# Patient Record
Sex: Female | Born: 1995 | Race: White | Hispanic: No | Marital: Single | Smoking: Never smoker
Health system: Southern US, Community
[De-identification: ages and names within clinical notes are randomized; demographics above are authoritative.]

## PROBLEM LIST (undated history)

## (undated) DIAGNOSIS — M799 Soft tissue disorder, unspecified: Secondary | ICD-10-CM

## (undated) DIAGNOSIS — D509 Iron deficiency anemia, unspecified: Secondary | ICD-10-CM

## (undated) DIAGNOSIS — Z8614 Personal history of Methicillin resistant Staphylococcus aureus infection: Secondary | ICD-10-CM

## (undated) HISTORY — PX: DENTAL SURGERY: SHX609

---
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
## 2016-02-05 DIAGNOSIS — D509 Iron deficiency anemia, unspecified: Secondary | ICD-10-CM | POA: Insufficient documentation

## 2016-12-06 DIAGNOSIS — N3000 Acute cystitis without hematuria: Secondary | ICD-10-CM | POA: Insufficient documentation

## 2017-09-15 ENCOUNTER — Other Ambulatory Visit: Payer: Self-pay | Admitting: Advanced Practice Midwife

## 2017-09-15 DIAGNOSIS — N6314 Unspecified lump in the right breast, lower inner quadrant: Secondary | ICD-10-CM

## 2017-09-23 ENCOUNTER — Other Ambulatory Visit: Payer: Self-pay | Admitting: Advanced Practice Midwife

## 2017-09-23 ENCOUNTER — Ambulatory Visit
Admission: RE | Admit: 2017-09-23 | Discharge: 2017-09-23 | Disposition: A | Discharge status: Home / Self Care | Payer: Medicaid Other | Source: Ambulatory Visit | Attending: Advanced Practice Midwife | Admitting: Advanced Practice Midwife

## 2017-09-23 DIAGNOSIS — N6310 Unspecified lump in the right breast, unspecified quadrant: Secondary | ICD-10-CM

## 2017-09-23 DIAGNOSIS — N6314 Unspecified lump in the right breast, lower inner quadrant: Secondary | ICD-10-CM

## 2018-03-24 ENCOUNTER — Inpatient Hospital Stay
Admission: RE | Admit: 2018-03-24 | Discharge: 2018-03-24 | Disposition: A | Discharge status: Home / Self Care | Payer: Medicaid Other | Source: Ambulatory Visit | Attending: Advanced Practice Midwife | Admitting: Advanced Practice Midwife

## 2018-04-01 ENCOUNTER — Other Ambulatory Visit: Payer: Self-pay

## 2018-04-01 ENCOUNTER — Emergency Department (HOSPITAL_COMMUNITY)
Admission: EM | Admit: 2018-04-01 | Discharge: 2018-04-01 | Disposition: A | Discharge status: Home / Self Care | Payer: Medicaid Other | Attending: Emergency Medicine | Admitting: Emergency Medicine

## 2018-04-01 ENCOUNTER — Emergency Department (HOSPITAL_COMMUNITY): Payer: Self-pay

## 2018-04-01 ENCOUNTER — Encounter (HOSPITAL_COMMUNITY): Payer: Self-pay | Admitting: Emergency Medicine

## 2018-04-01 DIAGNOSIS — S93422A Sprain of deltoid ligament of left ankle, initial encounter: Secondary | ICD-10-CM | POA: Insufficient documentation

## 2018-04-01 DIAGNOSIS — Y999 Unspecified external cause status: Secondary | ICD-10-CM | POA: Insufficient documentation

## 2018-04-01 DIAGNOSIS — Y9289 Other specified places as the place of occurrence of the external cause: Secondary | ICD-10-CM | POA: Insufficient documentation

## 2018-04-01 DIAGNOSIS — S80812A Abrasion, left lower leg, initial encounter: Secondary | ICD-10-CM | POA: Insufficient documentation

## 2018-04-01 DIAGNOSIS — Y9389 Activity, other specified: Secondary | ICD-10-CM | POA: Insufficient documentation

## 2018-04-01 DIAGNOSIS — Z79899 Other long term (current) drug therapy: Secondary | ICD-10-CM | POA: Insufficient documentation

## 2018-04-01 LAB — I-STAT BETA HCG BLOOD, ED (MC, WL, AP ONLY): I-stat hCG, quantitative: <5 m[IU]/mL (ref <5)

## 2018-04-01 NOTE — Discharge Instructions (Addendum)
Keep the abrasions clean and dry. Do use antibiotic ointment on the abrasions. You can take ibuprofen 600 mg + acetaminophen 650 mg 4 times a day for pain. Wear the ankle support. You should have your ankle rechecked if still painful in a week. You can call Dr Brooke Bonito office in [place], Dr Randel Pigg office in [place] or if you have an orthopedist, see that one. Use the crutches until you are able to walk on that foot.
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
## 2018-04-03 ENCOUNTER — Inpatient Hospital Stay: Admission: RE | Admit: 2018-04-03 | Payer: Medicaid Other | Source: Ambulatory Visit

## 2018-04-20 ENCOUNTER — Other Ambulatory Visit: Payer: Medicaid Other

## 2018-05-28 ENCOUNTER — Other Ambulatory Visit: Payer: Medicaid Other

## 2018-06-08 ENCOUNTER — Other Ambulatory Visit: Payer: Self-pay | Admitting: Advanced Practice Midwife

## 2018-06-08 ENCOUNTER — Ambulatory Visit
Admission: RE | Admit: 2018-06-08 | Discharge: 2018-06-08 | Disposition: A | Discharge status: Home / Self Care | Payer: BLUE CROSS/BLUE SHIELD | Source: Ambulatory Visit | Attending: Advanced Practice Midwife | Admitting: Advanced Practice Midwife

## 2018-06-08 DIAGNOSIS — N6310 Unspecified lump in the right breast, unspecified quadrant: Secondary | ICD-10-CM

## 2018-06-15 ENCOUNTER — Ambulatory Visit
Admission: RE | Admit: 2018-06-15 | Discharge: 2018-06-15 | Disposition: A | Discharge status: Home / Self Care | Payer: BLUE CROSS/BLUE SHIELD | Source: Ambulatory Visit | Attending: Advanced Practice Midwife | Admitting: Advanced Practice Midwife

## 2018-06-15 DIAGNOSIS — N6310 Unspecified lump in the right breast, unspecified quadrant: Secondary | ICD-10-CM

## 2018-11-23 ENCOUNTER — Ambulatory Visit: Payer: Self-pay | Admitting: *Deleted

## 2018-11-23 NOTE — Telephone Encounter (Signed)
Patient spent time with a friend last week whose father has been hospitalized with pneumonia. Her friend reports that the hospital is treating him as if he has the virus by isolating him. No corona test has been performed that has been told to the patient. Patient denies fever-does not have a thermometer but is trying to purchase at this time. Denies difficulty breathing. Denies travel to hot zones. Suggested patient quarantine self  and monitor her temperature with a thermometer. Reviewed symptoms to watch for and call back. Reviewed safety hygiene precautions. Stated she understood and knew when to call back.  Reason for Disposition . [1] COVID-19 EXPOSURE (Close Contact) within last 14 days AND [2] NO cough, fever, or breathing difficulty  Answer Assessment - Initial Assessment Questions 1. CONFIRMED CASE: "Who is the person with the confirmed COVID-19 infection that you were exposed to?"    Has not been confirmed 2. PLACE of CONTACT: "Where were you when you were exposed to COVID-19  (coronavirus disease 2019)?" (e.g., city, state, country)     3. TYPE of CONTACT: "How much contact was there?" (e.g., live in same house, work in same office, same school)    Occasionally over last week  4. DATE of CONTACT: "When did you have contact with a coronavirus patient?" (e.g., days)    Last week 5. DURATION of CONTACT: "How long were you in contact with the COVID-19 (coronavirus disease) patient?" (e.g., a few seconds, passed by person, a few minutes, live with the patient)    No direct contact 6. SYMPTOMS: "Do you have any symptoms?" (e.g., fever, cough, breathing difficulty)    No fever no difficulty breathing. 7. PREGNANCY OR POSTPARTUM: "Is there any chance you are pregnant?" "When was your last menstrual period?" "Did you deliver in the last 2 weeks?"     Did not ask 8. HIGH RISK: "Do you have any heart or lung problems? Do you have a weakened immune system?" (e.g., CHF, COPD, asthma, HIV positive,  chemotherapy, renal failure, diabetes mellitus, sickle cell anemia)    none  Protocols used: CORONAVIRUS (COVID-19) EXPOSURE-A-AH

## 2019-08-03 IMAGING — US ULTRASOUND RIGHT BREAST LIMITED
2 series · 5 of 5 positions shown · non-contrast
Comparison: Previous exam(s).

CLINICAL DATA: 22-year-old female presenting for first six-month
follow-up of a probably benign palpable right breast lump. Patient
states she subjectively feels this area may have increased in size
the interim.

EXAM:
ULTRASOUND OF THE RIGHT BREAST

[Series 1: ultrasound right breast limited · 0.06mm/px · 4 of 4 slices shown (1 of 2)]
[im 1/4]
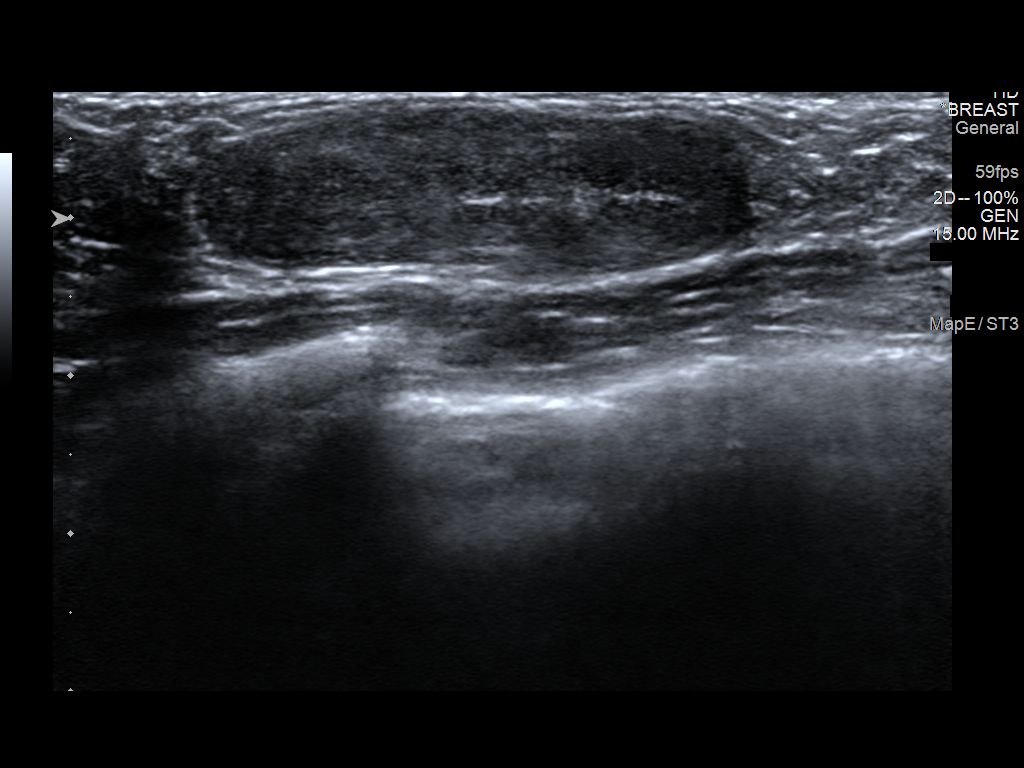
[im 2/4]
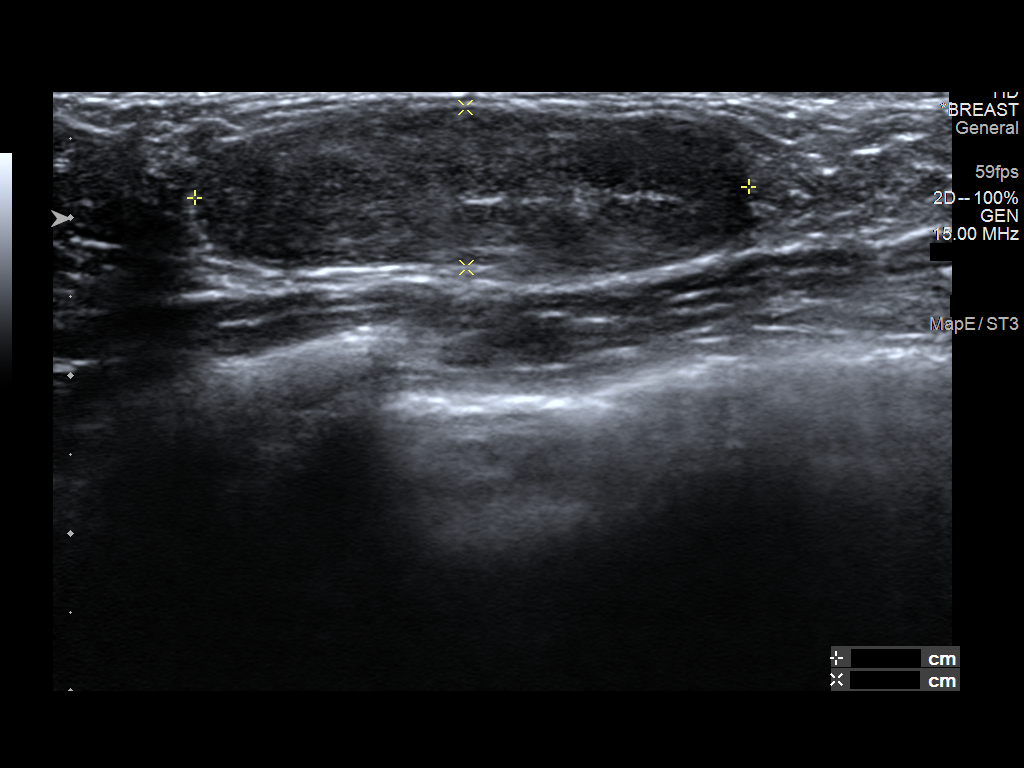
[im 3/4]
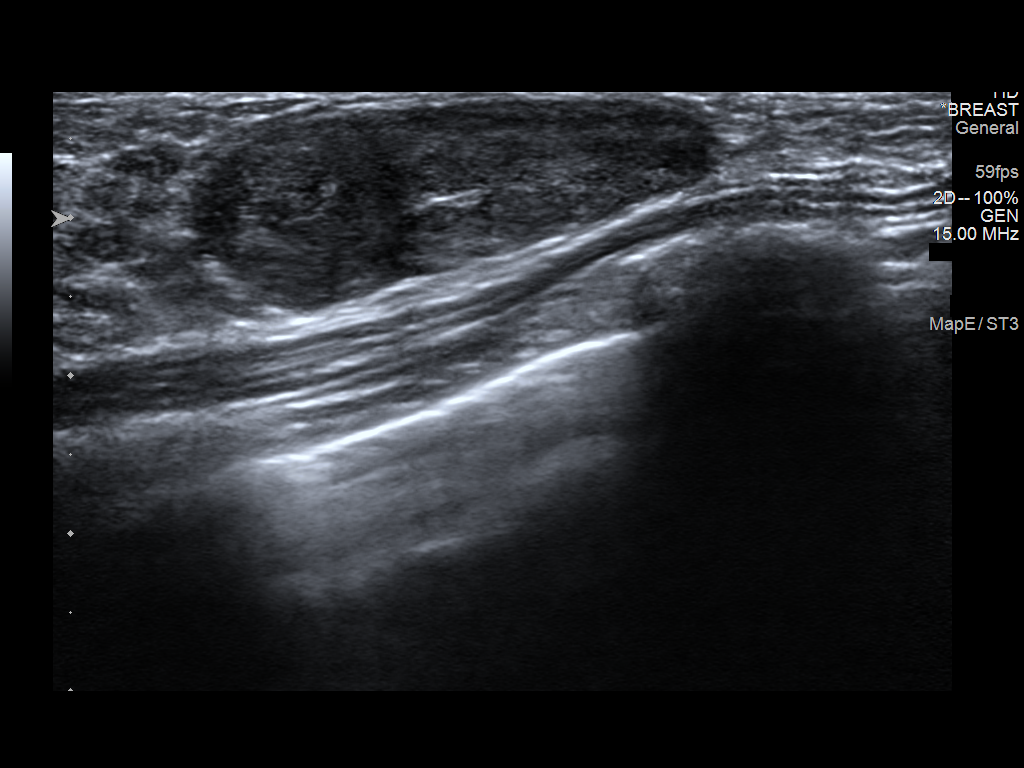
[im 4/4]
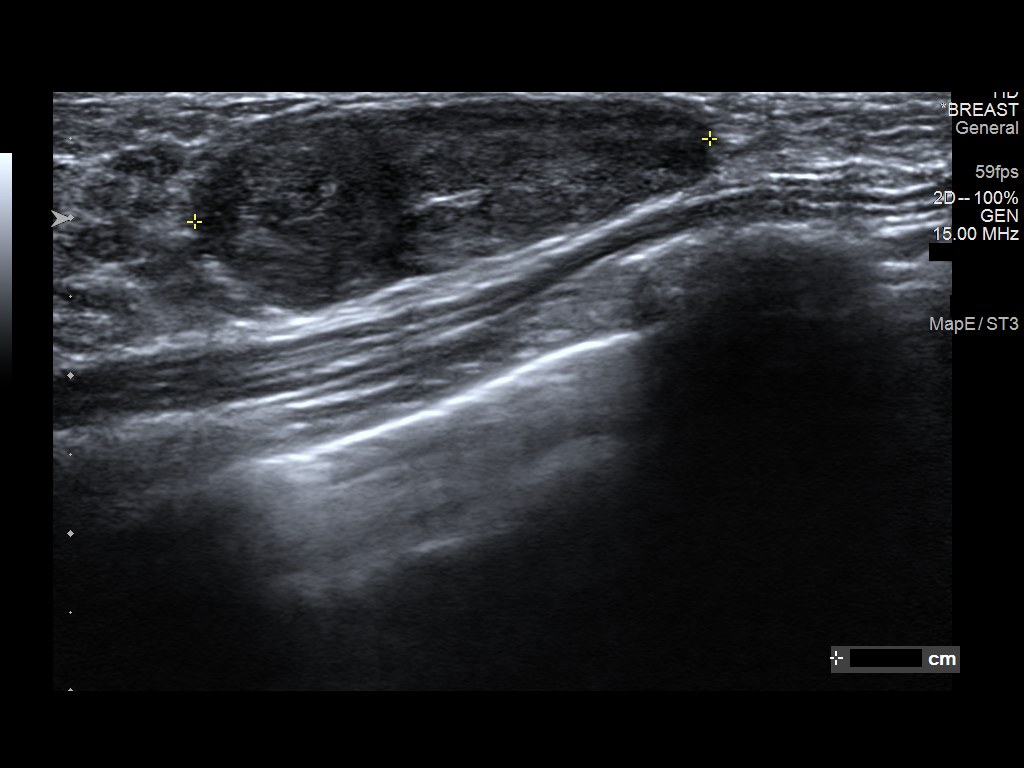

[Series 2: ultrasound right breast limited · 0.06mm/px · 1 of 1 slices shown (2 of 2)]
[im 1/1]
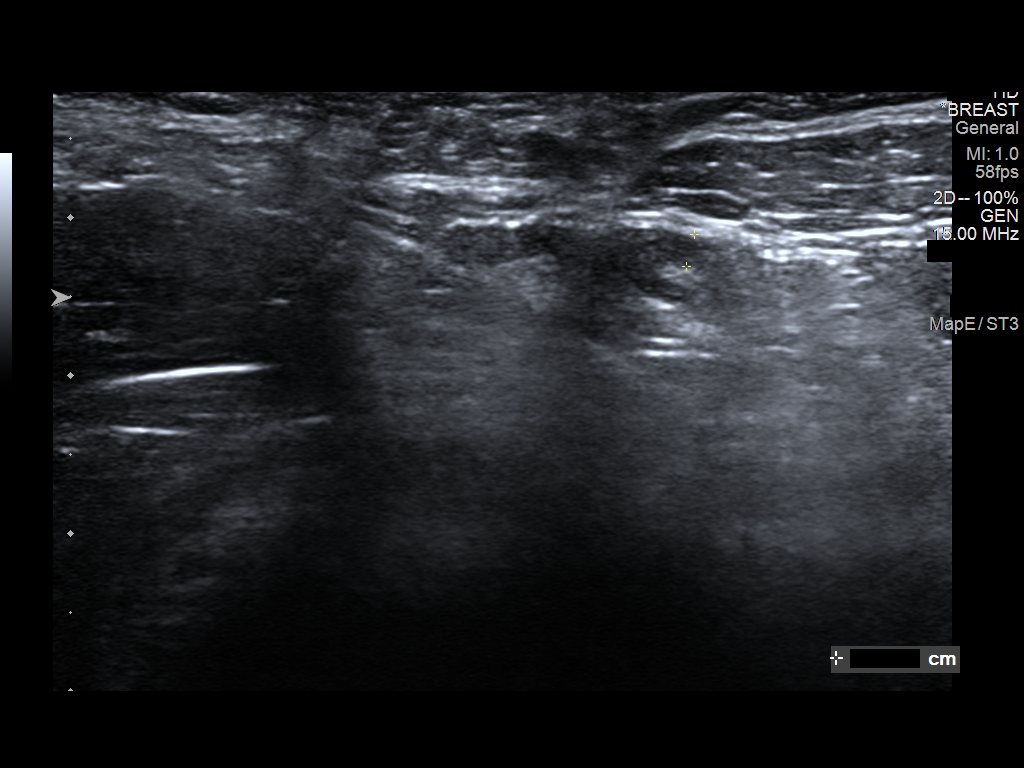

[5 of 5 positions shown; findings below may reference images not displayed]

FINDINGS: Targeted ultrasound is performed, showing similar appearance of an
oval, circumscribed hypoechoic mass at the 5 o'clock position 4 cm
from the nipple. Current measurements are 3.5 x 3.3 x 1 cm
(previously 3.0 x 2.8 x 1 cm). This represents a greater than 20%
increase in size.

Evaluation of the right axilla demonstrates no suspicious
lymphadenopathy.
IMPRESSION: 1. Indeterminate right breast mass. Recommendation is for
ultrasound-guided biopsy. Number
2. No suspicious right axillary lymphadenopathy.

RECOMMENDATION:
Ultrasound-guided biopsy for the patient's palpable right breast
mass.

I have discussed the findings and recommendations with the patient.
Results were also provided in writing at the conclusion of the
visit. If applicable, a reminder letter will be sent to the patient
regarding the next appointment.

BI-RADS CATEGORY  4: Suspicious.

## 2019-08-10 IMAGING — US US BREAST BX W LOC DEV 1ST LESION IMG BX SPEC US GUIDE*R*
1 series · 10 of 10 positions shown · non-contrast
Comparison: Previous exam(s).

ADDENDUM:
Pathology revealed FIBROADENOMA of the Right breast, 5 o'clock to 6
o'clock. This was found to be concordant by Dr. Mnsr Regrag.

Pathology results were discussed with the patient by telephone. The
patient reported doing well after the biopsy with tenderness at the
site. Post biopsy instructions and care were reviewed and questions
were answered. The patient was encouraged to call The [REDACTED]
The patient was instructed to continue with monthly self breast
examinations, clinical follow-up as needed, and to return for annual
mammography at 41. The patient was informed a reminder notice would
be sent regarding this appointment.
Pathology results reported by Don Lolito Onacram, RN on 06/16/2018.
CLINICAL DATA: Ultrasound-guided core needle biopsy was suggested
of a palpable right breast mass at 5 o'clock position 4 cm from the
nipple. The patient is 22 years old.
EXAM:
ULTRASOUND GUIDED RIGHT BREAST CORE NEEDLE BIOPSY

[Series 1: us breast bx w loc dev 1st lesion img bx spec us g · 0.07mm/px · 10 of 10 slices shown]
[im 1/10]
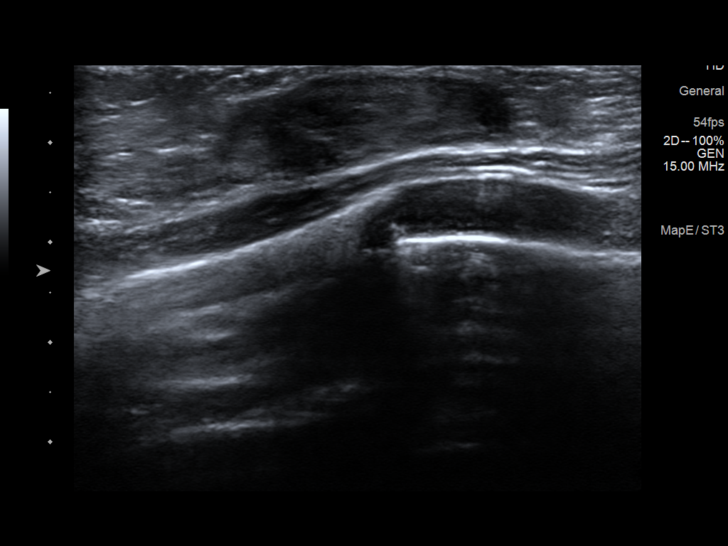
[im 2/10]
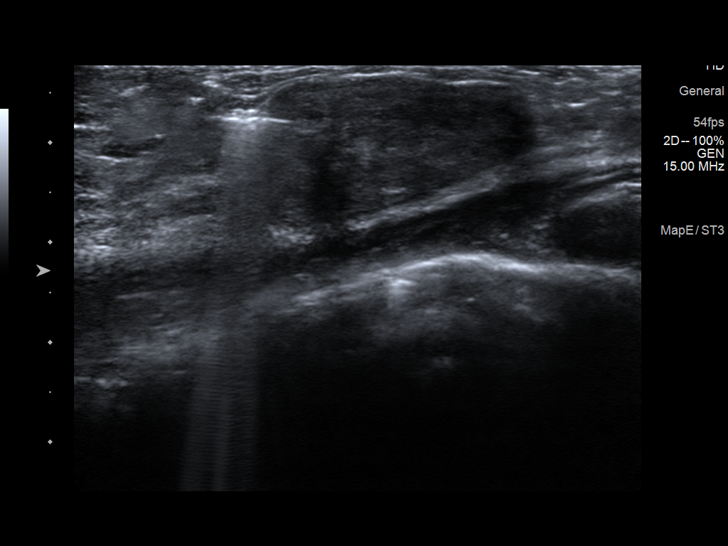
[im 3/10]
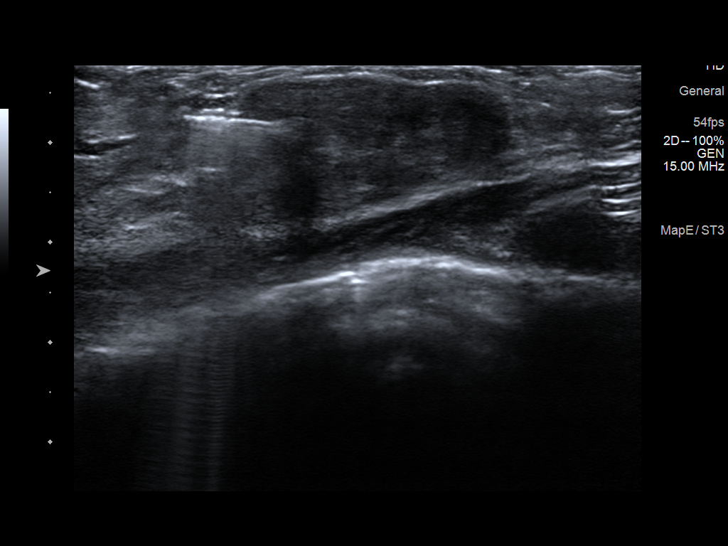
[im 4/10]
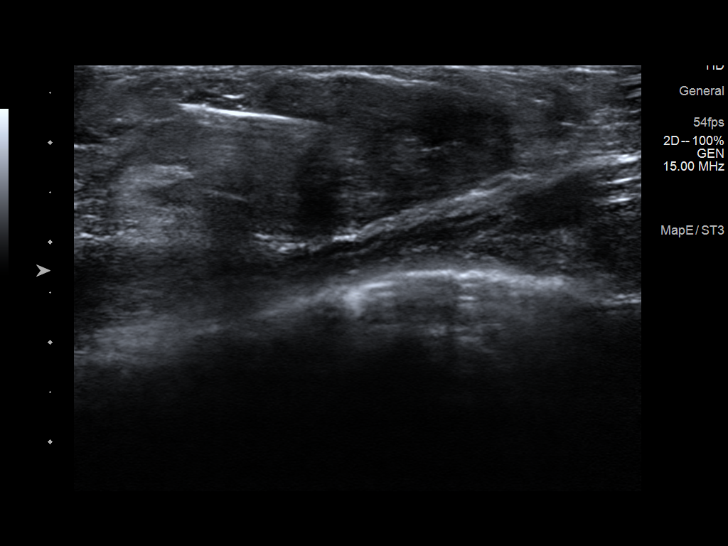
[im 5/10]
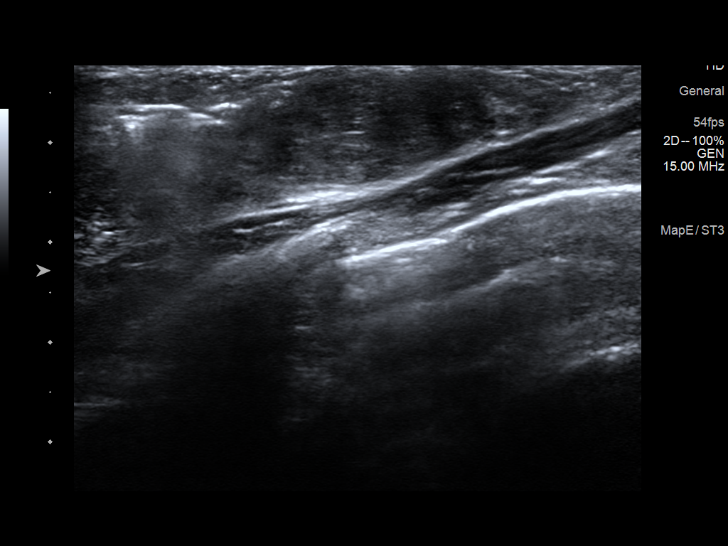
[im 6/10]
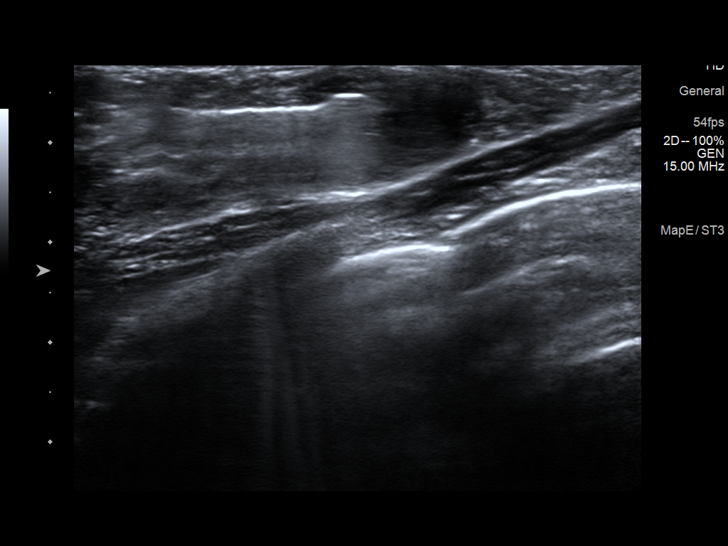
[im 7/10]
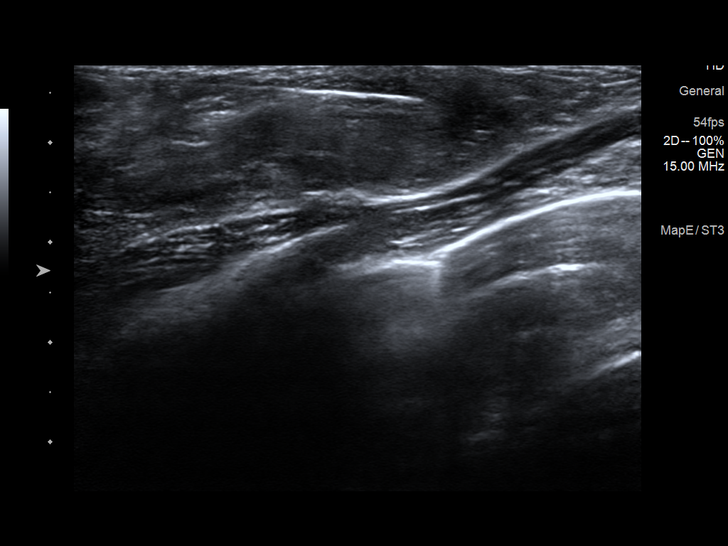
[im 8/10]
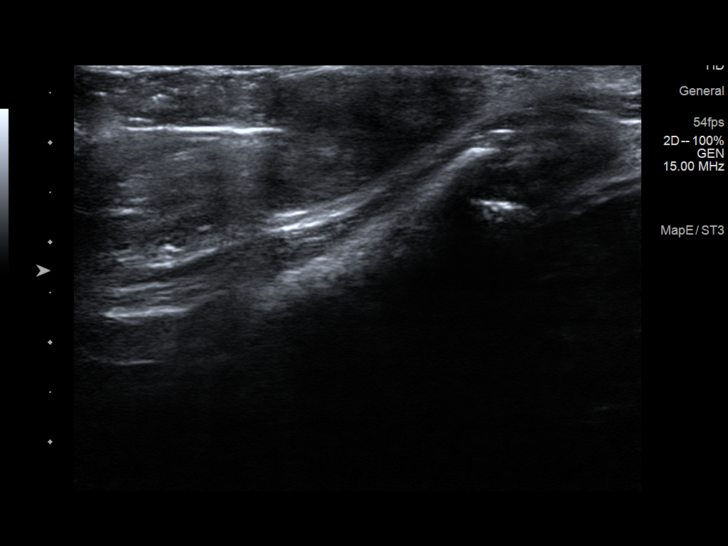
[im 9/10]
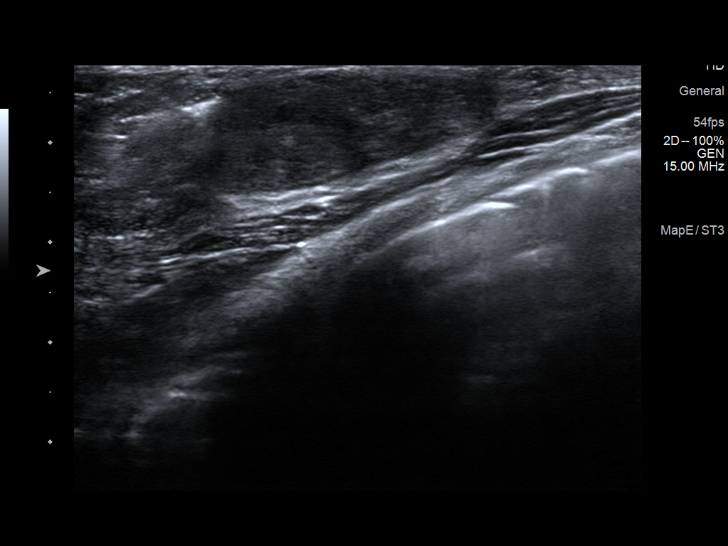
[im 10/10]
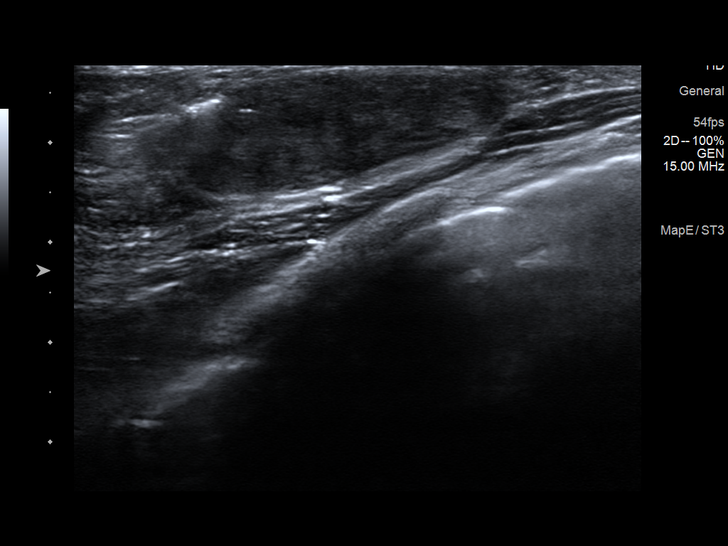

[10 of 10 positions shown; findings below may reference images not displayed]



Lesion quadrant: Lower inner quadrant

Using sterile technique and 1% Lidocaine as local anesthetic, under
direct ultrasound visualization, a 14 gauge Buitrago device was
used to perform biopsy of a hypoechoic circumscribed parallel mass
at 5 o'clock position using a lateral approach. At the conclusion of
the procedure a HydroMARK tissue marker clip was deployed into the
biopsy cavity along the lateral edge of the mass.
IMPRESSION: Ultrasound guided biopsy of the right breast. No apparent
complications.

## 2023-04-20 ENCOUNTER — Emergency Department (HOSPITAL_BASED_OUTPATIENT_CLINIC_OR_DEPARTMENT_OTHER): Payer: Medicaid Other | Admitting: Radiology

## 2023-04-20 ENCOUNTER — Emergency Department (HOSPITAL_BASED_OUTPATIENT_CLINIC_OR_DEPARTMENT_OTHER): Payer: Medicaid Other

## 2023-04-20 ENCOUNTER — Other Ambulatory Visit: Payer: Self-pay

## 2023-04-20 ENCOUNTER — Encounter (HOSPITAL_BASED_OUTPATIENT_CLINIC_OR_DEPARTMENT_OTHER): Payer: Self-pay | Admitting: Emergency Medicine

## 2023-04-20 ENCOUNTER — Emergency Department (HOSPITAL_BASED_OUTPATIENT_CLINIC_OR_DEPARTMENT_OTHER)
Admission: EM | Admit: 2023-04-20 | Discharge: 2023-04-20 | Disposition: A | Discharge status: Home / Self Care | Payer: Medicaid Other | Attending: Emergency Medicine | Admitting: Emergency Medicine

## 2023-04-20 DIAGNOSIS — R0789 Other chest pain: Secondary | ICD-10-CM | POA: Insufficient documentation

## 2023-04-20 DIAGNOSIS — R1084 Generalized abdominal pain: Secondary | ICD-10-CM | POA: Diagnosis present

## 2023-04-20 DIAGNOSIS — K5900 Constipation, unspecified: Secondary | ICD-10-CM | POA: Diagnosis not present

## 2023-04-20 DIAGNOSIS — R791 Abnormal coagulation profile: Secondary | ICD-10-CM | POA: Insufficient documentation

## 2023-04-20 DIAGNOSIS — R079 Chest pain, unspecified: Secondary | ICD-10-CM

## 2023-04-20 LAB — COMPREHENSIVE METABOLIC PANEL
ALT: 26 U/L (ref 0–44)
AST: 22 U/L (ref 15–41)
Albumin: 4.7 g/dL (ref 3.5–5.0)
Alkaline Phosphatase: 48 U/L (ref 38–126)
Anion gap: 8 (ref 5–15)
BUN: 8 mg/dL (ref 6–20)
CO2: 26 mmol/L (ref 22–32)
Calcium: 9.3 mg/dL (ref 8.9–10.3)
Chloride: 102 mmol/L (ref 98–111)
Creatinine, Ser: 0.76 mg/dL (ref 0.44–1.00)
GFR, Estimated: >60 mL/min (ref >60)
Glucose, Bld: 82 mg/dL (ref 70–99)
Potassium: 3.8 mmol/L (ref 3.5–5.1)
Sodium: 136 mmol/L (ref 135–145)
Total Bilirubin: 1 mg/dL (ref 0.3–1.2)
Total Protein: 7.8 g/dL (ref 6.5–8.1)

## 2023-04-20 LAB — URINALYSIS, ROUTINE W REFLEX MICROSCOPIC
Bilirubin Urine: NEGATIVE
Glucose, UA: NEGATIVE mg/dL
Hgb urine dipstick: NEGATIVE
Ketones, ur: NEGATIVE mg/dL
Leukocytes,Ua: NEGATIVE
Nitrite: NEGATIVE
Protein, ur: NEGATIVE mg/dL
Specific Gravity, Urine: 1.013 (ref 1.005–1.030)
pH: 5.5 (ref 5.0–8.0)

## 2023-04-20 LAB — TROPONIN I (HIGH SENSITIVITY)
Troponin I (High Sensitivity): <2 ng/L (ref <18)
Troponin I (High Sensitivity): <2 ng/L (ref <18)

## 2023-04-20 LAB — CBC
HCT: 37.4 % (ref 36.0–46.0)
Hemoglobin: 12.7 g/dL (ref 12.0–15.0)
MCH: 30.1 pg (ref 26.0–34.0)
MCHC: 34 g/dL (ref 30.0–36.0)
MCV: 88.6 fL (ref 80.0–100.0)
Platelets: 228 10*3/uL (ref 150–400)
RBC: 4.22 MIL/uL (ref 3.87–5.11)
RDW: 12.6 % (ref 11.5–15.5)
WBC: 6.8 10*3/uL (ref 4.0–10.5)
nRBC: 0 % (ref 0.0–0.2)

## 2023-04-20 LAB — D-DIMER, QUANTITATIVE: D-Dimer, Quant: 0.56 ug{FEU}/mL — ABNORMAL HIGH (ref 0.00–0.50)

## 2023-04-20 LAB — PREGNANCY, URINE: Preg Test, Ur: NEGATIVE

## 2023-04-20 LAB — LIPASE, BLOOD: Lipase: 24 U/L (ref 11–51)

## 2023-04-20 MED ORDER — IOHEXOL 350 MG/ML SOLN
100.0000 mL | Freq: Once | INTRAVENOUS | Status: AC | PRN
  Administered 2023-04-20: 75 mL via INTRAVENOUS

## 2023-04-20 MED ORDER — SIMETHICONE 40 MG/0.6ML PO SUSP (UNIT DOSE)
40.0000 mg | Freq: Once | ORAL | Status: AC
  Administered 2023-04-20: 40 mg via ORAL
  Filled 2023-04-20: qty 0.6

## 2023-04-20 NOTE — ED Triage Notes (Signed)
Last week ,Tuesday had severe pain in lower abd and around to her left side of back, was constipated, eventually had bm that was hard and felt better. States her lower abd stayed tender, yesterday pain started in lower abd and up her right side to her chest area. This pain feels sharp. Lbm yesterday.

## 2023-04-20 NOTE — ED Triage Notes (Signed)
Pt feels like whens he tries to yawn or take deep breath her right side feels like she can't get a deep breath, feels "blocked". Pt had been onvacation, did drive 5.5 hrs.

## 2023-04-20 NOTE — ED Notes (Signed)
Patient resting quietly in stretcher, respirations even, unlabored, no acute distress noted. Denies needs at this time.  

## 2023-04-20 NOTE — ED Notes (Signed)
Reviewed AVS with patient, patient expressed understanding of directions, denies further questions at this time. 

## 2023-04-20 NOTE — Discharge Instructions (Addendum)
You were seen in the ER today for evaluation of your belly pain and nonspecific chest pain.  Your CT imaging showed that you have constipated.  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  Your lab work is unremarkable.  Please make sure you are staying well-hydrated drinking plenty of fluids, mainly water.  I recommend trying MiraLAX to help soften your stool to allow you to poop.  You try 1 scoop in the morning and mix it with Gatorade, coffee, or water.  If you does not produce a bowel movement you can try another scoop at night.  If you still have not produced a bowel movement, you can try 2 scoops in the morning.  Please make sure you follow with a primary care doctor for reevaluation.  If you have any concerns of any worsening symptoms, please return to the nearest emergency department for evaluation.  Contact a health care provider if: You have pain that gets worse. You have a fever. You do not have a bowel movement after 4 days. You vomit. You are not hungry or you lose weight. You are bleeding from the opening between the buttocks (anus). You have thin, pencil-like stools. Get help right away if: You have a fever and your symptoms suddenly get worse. You leak stool or have blood in your stool. Your abdomen is bloated. You have severe pain in your abdomen. You feel dizzy or you faint.

## 2023-04-20 NOTE — ED Provider Notes (Signed)
[place] EMERGENCY DEPARTMENT AT [facility] Provider Note   CSN: [id number] Arrival date & time: 04/20/23  1511     History {Add pertinent medical, surgical, social history, OB history to HPI:1} No chief complaint on file.   Chelsea Contreras is a 27 y.o. female.  HPI     Home Medications Prior to Admission medications   Medication Sig Start Date End Date Taking? Authorizing Provider  ferrous fumarate (HEMOCYTE - 106 MG FE) 325 (106 FE) MG TABS tablet Take 1 tablet by mouth.    [provider]  norethindrone-ethinyl estradiol (OVCON-50) 1-50 MG-MCG tablet Take 1 tablet by mouth daily.    [provider]      Allergies    Patient has no known allergies.    Review of Systems   Review of Systems  Physical Exam Updated Vital Signs BP 110/67   Pulse 67   Temp 98.1 F (36.7 C)   Resp 18   SpO2 97%  Physical Exam  ED Results / Procedures / Treatments   Labs (all labs ordered are listed, but only abnormal results are displayed) Labs Reviewed  D-DIMER, QUANTITATIVE - Abnormal; Notable for the following components:      Result Value   D-Dimer, Quant 0.56 (*)    All other components within normal limits  LIPASE, BLOOD  COMPREHENSIVE METABOLIC PANEL  CBC  URINALYSIS, ROUTINE W REFLEX MICROSCOPIC  PREGNANCY, URINE  TROPONIN I (HIGH SENSITIVITY)  TROPONIN I (HIGH SENSITIVITY)    EKG None  Radiology CT Angio Chest PE W and/or Wo Contrast  Result Date: 04/20/2023 CLINICAL DATA:  Pulmonary embolism (PE) suspected, low to intermediate prob, positive D-dimer; Abdominal pain, acute, nonlocalized. Right-sided chest pain. EXAM: CT ANGIOGRAPHY CHEST CT ABDOMEN AND PELVIS WITH CONTRAST TECHNIQUE: Multidetector CT imaging of the chest was performed using the standard protocol during bolus administration of intravenous contrast. Multiplanar CT image reconstructions and MIPs were obtained to evaluate the vascular anatomy. Multidetector CT imaging of  the abdomen and pelvis was performed using the standard protocol during bolus administration of intravenous contrast. RADIATION DOSE REDUCTION: This exam was performed according to the departmental dose-optimization program which includes automated exposure control, adjustment of the mA and/or kV according to patient size and/or use of iterative reconstruction technique. CONTRAST:  75mL OMNIPAQUE IOHEXOL 350 MG/ML SOLN COMPARISON:  Breast ultrasound 06/15/2018. FINDINGS: CTA CHEST FINDINGS Cardiovascular: Satisfactory opacification of the pulmonary arteries to the segmental level. No evidence of pulmonary embolism. Normal heart size. No pericardial effusion. Mediastinum/Nodes: No enlarged mediastinal, hilar, or axillary lymph nodes. Thyroid gland, trachea, and esophagus demonstrate no significant findings. Lungs/Pleura: Lungs are clear. No pleural effusion or pneumothorax. 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 No acute bony abnormality. Review of the MIP images confirms the above findings. CT ABDOMEN and PELVIS FINDINGS Hepatobiliary: No focal liver abnormality is seen. No gallstones, gallbladder wall thickening, or biliary dilatation. Pancreas: Unremarkable. No pancreatic ductal dilatation or surrounding inflammatory changes. Spleen: Normal in size without focal abnormality. Adrenals/Urinary Tract: Adrenal glands are unremarkable. Kidneys are normal, without renal calculi, focal lesion, or hydronephrosis. Bladder is unremarkable. Stomach/Bowel: Normal stomach and duodenum. No dilated loops of small bowel. Normal appendix is visualized on coronal image 49 series 5. Colon is unremarkable. No bowel wall thickening or surrounding inflammation. Vascular/Lymphatic: No significant vascular findings are present. No enlarged abdominal or pelvic lymph nodes. Reproductive: 3.9 cm peripherally enhancing cyst in the right ovary, likely follicular cyst (no  follow-up imaging is recommended). The uterus and left adnexa are unremarkable. Other: Small volume free fluid in the pelvis, likely physiologic. Musculoskeletal: No acute or significant osseous findings. Review of the MIP images confirms the above findings. IMPRESSION: 1. No evidence of pulmonary embolism or other acute intrathoracic process. 2. No acute findings in the abdomen or pelvis. Electronically Signed   By: Orvan Falconer M.D.   On: 04/20/2023 18:12   CT ABDOMEN PELVIS W CONTRAST  Result Date: 04/20/2023 CLINICAL DATA:  Pulmonary embolism (PE) suspected, low to intermediate prob, positive D-dimer; Abdominal pain, acute, nonlocalized. Right-sided chest pain. EXAM: CT ANGIOGRAPHY CHEST CT ABDOMEN AND PELVIS WITH CONTRAST TECHNIQUE: Multidetector CT imaging of the chest was performed using the standard protocol during bolus administration of intravenous contrast. Multiplanar CT image reconstructions and MIPs were obtained to evaluate the vascular anatomy. Multidetector CT imaging of the abdomen and pelvis was performed using the standard protocol during bolus administration of intravenous contrast. RADIATION DOSE REDUCTION: This exam was performed according to the departmental dose-optimization program which includes automated exposure control, adjustment of the mA and/or kV according to patient size and/or use of iterative reconstruction technique. CONTRAST:  75mL OMNIPAQUE IOHEXOL 350 MG/ML SOLN COMPARISON:  Breast ultrasound 06/15/2018. FINDINGS: CTA CHEST FINDINGS Cardiovascular: Satisfactory opacification of the pulmonary arteries to the segmental level. No evidence of pulmonary embolism. Normal heart size. No pericardial effusion. Mediastinum/Nodes: No enlarged mediastinal, hilar, or axillary lymph nodes. Thyroid gland, trachea, and esophagus demonstrate no significant findings. Lungs/Pleura: Lungs are clear. No pleural effusion or pneumothorax. Musculoskeletal: Ovoid mass in the lower right  breast, consistent with previously biopsy proven fibroadenoma. No acute bony abnormality. Review of the MIP images confirms the above findings. CT ABDOMEN and PELVIS FINDINGS Hepatobiliary: No focal liver abnormality is seen. No gallstones, gallbladder wall thickening, or biliary dilatation. Pancreas: Unremarkable. No pancreatic ductal dilatation or surrounding inflammatory changes. Spleen: Normal in size without focal abnormality. Adrenals/Urinary Tract: Adrenal glands are unremarkable. Kidneys are normal, without renal calculi, focal lesion, or hydronephrosis. Bladder is unremarkable. Stomach/Bowel: Normal stomach and duodenum. No dilated loops of small bowel. Normal appendix is visualized on coronal image 49 series 5. Colon is unremarkable. No bowel wall thickening or surrounding inflammation. Vascular/Lymphatic: No significant vascular findings are present. No enlarged abdominal or pelvic lymph nodes. Reproductive: 3.9 cm peripherally enhancing cyst in the right ovary, likely follicular cyst (no follow-up imaging is recommended). The uterus and left adnexa are unremarkable. Other: Small volume free fluid in the pelvis, likely physiologic. Musculoskeletal: No acute or significant osseous findings. Review of the MIP images confirms the above findings. IMPRESSION: 1. No evidence of pulmonary embolism or other acute intrathoracic process. 2. No acute findings in the abdomen or pelvis. Electronically Signed   By: Orvan Falconer M.D.   On: 04/20/2023 18:12   DG Chest 2 View  Result Date: 04/20/2023 CLINICAL DATA:  Difficulty breathing EXAM: CHEST - 2 VIEW COMPARISON:  None Available. FINDINGS: The heart size and mediastinal contours are within normal limits. Both lungs are clear. The visualized skeletal structures are unremarkable. IMPRESSION: No active cardiopulmonary disease. Electronically Signed   By: Darliss Cheney M.D.   On: 04/20/2023 17:49    Procedures Procedures  {Document cardiac monitor, telemetry  assessment procedure when appropriate:1}  Medications Ordered in ED Medications  simethicone (MYLICON) 40 mg/0.48ml suspension 40 mg (has no administration in time range)  iohexol (OMNIPAQUE) 350 MG/ML injection 100 mL (75 mLs Intravenous Contrast Given 04/20/23 1754)    ED Course/ Medical  Decision Making/ A&P   {   Click here for ABCD2, HEART and other calculatorsREFRESH Note before signing :1}                              Medical Decision Making Amount and/or Complexity of Data Reviewed Labs: ordered. Radiology: ordered.  Risk Prescription drug management.   ***  {Document critical care time when appropriate:1} {Document review of labs and clinical decision tools ie heart score, Chads2Vasc2 etc:1}  {Document your independent review of radiology images, and any outside records:1} {Document your discussion with family members, caretakers, and with consultants:1} {Document social determinants of health affecting pt's care:1} {Document your decision making why or why not admission, treatments were needed:1} Final Clinical Impression(s) / ED Diagnoses Final diagnoses:  None    Rx / DC Orders ED Discharge Orders     None

## 2023-07-12 ENCOUNTER — Emergency Department (HOSPITAL_BASED_OUTPATIENT_CLINIC_OR_DEPARTMENT_OTHER): Payer: Medicaid Other

## 2023-07-12 ENCOUNTER — Emergency Department (HOSPITAL_BASED_OUTPATIENT_CLINIC_OR_DEPARTMENT_OTHER)
Admission: EM | Admit: 2023-07-12 | Discharge: 2023-07-12 | Disposition: A | Discharge status: Home / Self Care | Payer: Medicaid Other | Attending: Emergency Medicine | Admitting: Emergency Medicine

## 2023-07-12 ENCOUNTER — Other Ambulatory Visit: Payer: Self-pay

## 2023-07-12 DIAGNOSIS — D72829 Elevated white blood cell count, unspecified: Secondary | ICD-10-CM | POA: Diagnosis not present

## 2023-07-12 DIAGNOSIS — E871 Hypo-osmolality and hyponatremia: Secondary | ICD-10-CM | POA: Insufficient documentation

## 2023-07-12 DIAGNOSIS — R109 Unspecified abdominal pain: Secondary | ICD-10-CM | POA: Diagnosis present

## 2023-07-12 DIAGNOSIS — K649 Unspecified hemorrhoids: Secondary | ICD-10-CM | POA: Insufficient documentation

## 2023-07-12 DIAGNOSIS — R195 Other fecal abnormalities: Secondary | ICD-10-CM | POA: Insufficient documentation

## 2023-07-12 DIAGNOSIS — R1084 Generalized abdominal pain: Secondary | ICD-10-CM

## 2023-07-12 LAB — COMPREHENSIVE METABOLIC PANEL
ALT: 12 U/L (ref 0–44)
AST: 15 U/L (ref 15–41)
Albumin: 4.8 g/dL (ref 3.5–5.0)
Alkaline Phosphatase: 54 U/L (ref 38–126)
Anion gap: 6 (ref 5–15)
BUN: 7 mg/dL (ref 6–20)
CO2: 27 mmol/L (ref 22–32)
Calcium: 9.9 mg/dL (ref 8.9–10.3)
Chloride: 101 mmol/L (ref 98–111)
Creatinine, Ser: 0.67 mg/dL (ref 0.44–1.00)
GFR, Estimated: >60 mL/min (ref >60)
Glucose, Bld: 111 mg/dL — ABNORMAL HIGH (ref 70–99)
Potassium: 4.1 mmol/L (ref 3.5–5.1)
Sodium: 134 mmol/L — ABNORMAL LOW (ref 135–145)
Total Bilirubin: 0.8 mg/dL (ref <1.2)
Total Protein: 7.9 g/dL (ref 6.5–8.1)

## 2023-07-12 LAB — CBC
HCT: 40.3 % (ref 36.0–46.0)
Hemoglobin: 13.8 g/dL (ref 12.0–15.0)
MCH: 30 pg (ref 26.0–34.0)
MCHC: 34.2 g/dL (ref 30.0–36.0)
MCV: 87.6 fL (ref 80.0–100.0)
Platelets: 222 10*3/uL (ref 150–400)
RBC: 4.6 MIL/uL (ref 3.87–5.11)
RDW: 12.9 % (ref 11.5–15.5)
WBC: 10.7 10*3/uL — ABNORMAL HIGH (ref 4.0–10.5)
nRBC: 0 % (ref 0.0–0.2)

## 2023-07-12 LAB — URINALYSIS, ROUTINE W REFLEX MICROSCOPIC
Bilirubin Urine: NEGATIVE
Glucose, UA: NEGATIVE mg/dL
Hgb urine dipstick: NEGATIVE
Ketones, ur: NEGATIVE mg/dL
Leukocytes,Ua: NEGATIVE
Nitrite: NEGATIVE
Protein, ur: NEGATIVE mg/dL
Specific Gravity, Urine: 1.019 (ref 1.005–1.030)
pH: 7.5 (ref 5.0–8.0)

## 2023-07-12 LAB — PREGNANCY, URINE: Preg Test, Ur: NEGATIVE

## 2023-07-12 LAB — LIPASE, BLOOD: Lipase: 14 U/L (ref 11–51)

## 2023-07-12 LAB — OCCULT BLOOD X 1 CARD TO LAB, STOOL: Fecal Occult Bld: POSITIVE — AB

## 2023-07-12 MED ORDER — IOHEXOL 300 MG/ML  SOLN
100.0000 mL | Freq: Once | INTRAMUSCULAR | Status: AC | PRN
  Administered 2023-07-12: 100 mL via INTRAVENOUS

## 2023-07-12 MED ORDER — DICYCLOMINE HCL 20 MG PO TABS: 20.0000 mg | ORAL_TABLET | Freq: Two times a day (BID) | ORAL | 0 refills | Status: AC | PRN

## 2023-07-12 MED ORDER — KETOROLAC TROMETHAMINE 15 MG/ML IJ SOLN
15.0000 mg | Freq: Once | INTRAMUSCULAR | Status: AC
  Administered 2023-07-12: 15 mg via INTRAVENOUS
  Filled 2023-07-12: qty 1

## 2023-07-12 MED ORDER — DICYCLOMINE HCL 10 MG PO CAPS
10.0000 mg | ORAL_CAPSULE | Freq: Once | ORAL | Status: AC
  Administered 2023-07-12: 10 mg via ORAL
  Filled 2023-07-12: qty 1

## 2023-07-12 MED ORDER — ONDANSETRON 4 MG PO TBDP
4.0000 mg | ORAL_TABLET | Freq: Once | ORAL | Status: AC | PRN
  Administered 2023-07-12: 4 mg via ORAL
  Filled 2023-07-12: qty 1

## 2023-07-12 NOTE — ED Provider Notes (Signed)
[place] EMERGENCY DEPARTMENT AT [facility] Provider Note   CSN: [id number] Arrival date & time: 07/12/23  1451     History  Chief Complaint  Patient presents with   Abdominal Pain    Chelsea Contreras is a 27 y.o. female.   Abdominal Pain   27 year old female presents to the emergency department with complaints of abdominal pain.  Patient states that abdominal pain began this morning after awakening.  States that she had the urge to have a bowel movement and reportedly had loose bowel movement with evidence of streaky blood in stool.  States that she also had period of feeling nauseated with 1 episode of emesis today that was nonbloody or coffee-ground in appearance.  States she has been dealing with intermittent abdominal pain ever since August when she was initially seen in the emergency department.  Had negative CT scan at that time.  Has been following with GI in the outpatient setting has had 1 appointment.  Denies any fever, chills, urinary symptoms, vaginal symptoms.  Past medical history significant for anemia  Home Medications Prior to Admission medications   Medication Sig Start Date End Date Taking? Authorizing Provider  dicyclomine (BENTYL) 20 MG tablet Take 1 tablet (20 mg total) by mouth 2 (two) times daily as needed for spasms. 07/12/23  Yes Sherian Maroon A, PA  ferrous fumarate (HEMOCYTE - 106 MG FE) 325 (106 FE) MG TABS tablet Take 1 tablet by mouth.    [provider]  norethindrone-ethinyl estradiol (OVCON-50) 1-50 MG-MCG tablet Take 1 tablet by mouth daily.    [provider]      Allergies    Patient has no known allergies.    Review of Systems   Review of Systems  Gastrointestinal:  Positive for abdominal pain.  All other systems reviewed and are negative.   Physical Exam Updated Vital Signs BP 111/67   Pulse 62   Temp 98.5 F (36.9 C)   Resp 18   Ht 5\' 2"  (1.575 m)   Wt 52.6 kg   LMP 06/23/2023 (Exact Date)   SpO2  98%   BMI 21.22 kg/m  Physical Exam Vitals and nursing note reviewed. Exam conducted with a chaperone present.  Constitutional:      General: She is not in acute distress.    Appearance: She is well-developed.  HENT:     Head: Normocephalic and atraumatic.  Eyes:     Conjunctiva/sclera: Conjunctivae normal.  Cardiovascular:     Rate and Rhythm: Normal rate and regular rhythm.     Heart sounds: No murmur heard. Pulmonary:     Effort: Pulmonary effort is normal. No respiratory distress.     Breath sounds: Normal breath sounds.  Abdominal:     Palpations: Abdomen is soft.     Tenderness: There is abdominal tenderness in the right lower quadrant, epigastric area and left upper quadrant. There is no right CVA tenderness, left CVA tenderness or guarding.  Genitourinary:    Comments: Rectal exam performed with female nurse staff at bedside.  External exam did show evidence of external hemorrhoid at 4 o'clock position.  Hemorrhoid nonthrombosed with no appreciable active bleeding.  No internal palpable mass/fissure.  Small amount of clay colored stool obtained and sent via Hemoccult which was positive. Musculoskeletal:        General: No swelling.     Cervical back: Neck supple.  Skin:    General: Skin is warm and dry.     Capillary Refill: Capillary  refill takes less than 2 seconds.  Neurological:     Mental Status: She is alert.  Psychiatric:        Mood and Affect: Mood normal.     ED Results / Procedures / Treatments   Labs (all labs ordered are listed, but only abnormal results are displayed) Labs Reviewed  COMPREHENSIVE METABOLIC PANEL - Abnormal; Notable for the following components:      Result Value   Sodium 134 (*)    Glucose, Bld 111 (*)    All other components within normal limits  CBC - Abnormal; Notable for the following components:   WBC 10.7 (*)    All other components within normal limits  OCCULT BLOOD X 1 CARD TO LAB, STOOL - Abnormal; Notable for the  following components:   Fecal Occult Bld POSITIVE (*)    All other components within normal limits  LIPASE, BLOOD  URINALYSIS, ROUTINE W REFLEX MICROSCOPIC  PREGNANCY, URINE    EKG None  Radiology CT ABDOMEN PELVIS W CONTRAST  Result Date: 07/12/2023 CLINICAL DATA:  Right lower quadrant pain EXAM: CT ABDOMEN AND PELVIS WITH CONTRAST TECHNIQUE: Multidetector CT imaging of the abdomen and pelvis was performed using the standard protocol following bolus administration of intravenous contrast. RADIATION DOSE REDUCTION: This exam was performed according to the departmental dose-optimization program which includes automated exposure control, adjustment of the mA and/or kV according to patient size and/or use of iterative reconstruction technique. CONTRAST:  OMNIPAQUE IOHEXOL 300 MG/ML  SOLN COMPARISON:  CT abdomen and pelvis 04/20/2023 FINDINGS: Lower chest: No acute abnormality. Hepatobiliary: There is a subcentimeter hypodensity in the right lobe of the liver image 2/21 which is unchanged from prior and too small to characterize. The liver is otherwise within normal limits. Gallbladder and bile ducts are within normal limits. Pancreas: Unremarkable. No pancreatic ductal dilatation or surrounding inflammatory changes. Spleen: Normal in size without focal abnormality. Adrenals/Urinary Tract: Adrenal glands are unremarkable. Kidneys are normal, without renal calculi, focal lesion, or hydronephrosis. Bladder is unremarkable. Stomach/Bowel: The colon is completely decompressed from the level of the transverse colon to the level the rectum. There is no surrounding inflammation, but mild diffuse wall thickening is not excluded. The appendix, small bowel and stomach are within normal limits. Vascular/Lymphatic: No significant vascular findings are present. No enlarged abdominal or pelvic lymph nodes. Reproductive: There is a collapsing cyst or follicle in the right ovary measuring 2 cm which has decreased in  size. No follow-up imaging necessary. The left ovary and uterus are within normal limits. Other: There is trace free fluid in the pelvis. There is no focal abdominal wall hernia. Musculoskeletal: No acute or significant osseous findings. IMPRESSION: 1. The colon is completely decompressed from the level of the transverse colon to the level the rectum and mild wall thickening is not excluded. Correlate clinically for colitis. 2. Trace free fluid in the pelvis. 3. Collapsing cyst or follicle in the right ovary measuring 2 cm. No follow-up imaging necessary. 4. Normal appendix. Electronically Signed   By: Darliss Cheney M.D.   On: 07/12/2023 17:38    Procedures Procedures    Medications Ordered in ED Medications  ondansetron (ZOFRAN-ODT) disintegrating tablet 4 mg (4 mg Oral Given 07/12/23 1703)  dicyclomine (BENTYL) capsule 10 mg (10 mg Oral Given 07/12/23 1702)  ketorolac (TORADOL) 15 MG/ML injection 15 mg (15 mg Intravenous Given 07/12/23 1703)  iohexol (OMNIPAQUE) 300 MG/ML solution 100 mL (100 mLs Intravenous Contrast Given 07/12/23 1632)    ED  Course/ Medical Decision Making/ A&P                                 Medical Decision Making Amount and/or Complexity of Data Reviewed Labs: ordered. Radiology: ordered.  Risk Prescription drug management.   This patient presents to the ED for concern of abdominal pain, this involves an extensive number of treatment options, and is a complaint that carries with it a high risk of complications and morbidity.  The differential diagnosis includes appendicitis, ectopic pregnancy, ovarian torsion, diverticulitis, colitis, pyelonephritis, nephrolithiasis, cystitis, IBD, IBS, gastritis, PUD, CBD pathology, cholecystitis, other   Co morbidities that complicate the patient evaluation  See HPI   Additional history obtained:  Additional history obtained from EMR External records from outside source obtained and reviewed including hospital  records   Lab Tests:  I Ordered, and personally interpreted labs.  The pertinent results include: Slight leukocytosis of 10.7.  No evidence of anemia.  Platelets within normal range.  Mild hyponatremia 134 but otherwise electrolytes within normal limits.  No transaminitis.  No renal dysfunction.  UA without abnormality.  Urine pregnancy negative.  Occult positive   Imaging Studies ordered:  I ordered imaging studies including ct abdomen pelvis  I independently visualized and interpreted imaging which showed decompressed colon.  Trace of fluid in pelvis.  Collapsing cyst of right ovary.  Normal appendix. I agree with the radiologist interpretation  Cardiac Monitoring: / EKG:  The patient was maintained on a cardiac monitor.  I personally viewed and interpreted the cardiac monitored which showed an underlying rhythm of: sinus rhythm   Consultations Obtained:  N/a   Problem List / ED Course / Critical interventions / Medication management  Abdominal pain I ordered medication including Bentyl, Toradol, Zofran   Reevaluation of the patient after these medicines showed that the patient improved I have reviewed the patients home medicines and have made adjustments as needed   Social Determinants of Health:  Denies tobacco, licit drug use   Test / Admission - Considered:  Abdominal pain Vitals signs within normal range and stable throughout visit. Laboratory/imaging studies significant for: See above 27 year old female presents emergency department with complaints of abdominal pain and 1 episode of loose bowel movement with streaky blood as well as 1 episode of emesis.  Patient with history of intermittent abdominal pain with diarrhea/constipation alternating followed by GI in the outpatient setting.  Was seen in the ED in August for similar symptoms.  On exam, patient with tenderness right lower quadrant as well as epigastric and left upper quadrant region.  Exam did show evidence  of external hemorrhoid.  Nonthrombosed without active bleeding.  Small amount of liquid stool was obtained and sent which was Hemoccult positive labs reassuring.  CT with evidence of underdistended colon.  Patient did note resolution of abdominal pain with administration of Toradol as well as Bentyl.  Subsequently passage of p.o. trial.  Unsure of exact etiology of patient's symptoms but could be secondary to potentially underlying IBD, celiac, IBS.  Patient with [place] score of 7-8.  Patient already has establish care in the outpatient setting with GI.  Recommend use of Bentyl as needed, bland diet and follow-up with GI in the outpatient setting.  Treatment plan discussed at length with patient and she acknowledged understanding was agreeable to said plan.  Patient overall well-appearing, afebrile in no acute distress. Worrisome signs and symptoms were discussed with the patient, and  the patient acknowledged understanding to return to the ED if noticed. Patient was stable upon discharge.          Final Clinical Impression(s) / ED Diagnoses Final diagnoses:  Generalized abdominal pain  Fecal occult blood test positive    Rx / DC Orders ED Discharge Orders          Ordered    dicyclomine (BENTYL) 20 MG tablet  2 times daily PRN        07/12/23 1811              Peter Garter, [place] 07/12/23 1841    Terald Sleeper, MD 07/12/23 [id number]

## 2023-07-12 NOTE — ED Triage Notes (Signed)
Pt POV from home reporting sharp gen abd pain and bloody diarrhea since this morning + one episode of emesis. Multiple episodes of same past few weeks, seen by GI last week told workup was normal.

## 2023-07-12 NOTE — Discharge Instructions (Addendum)
As discussed, CT imaging was reassuring.  No obvious abnormality appreciated on CT imaging.  Your stool sample did come back positive for blood.  Will recommend bland diet as well as medication called Bentyl to take as needed for abdominal discomfort.  If blood becomes more pronounced filling up the toilet bowl, abdominal pain worsens or does not respond to medication, you develop intractable nausea/vomiting, you develop fever, please return to emergency department.  Otherwise, I recommend follow-up with GI in the outpatient setting as they will likely perform colonoscopy.

## 2023-07-12 NOTE — ED Notes (Signed)
Reviewed discharge instructions, medications, and home care with pt. Pt verbalized understanding and had no further questions. Pt exited ED without complications.

## 2024-03-15 ENCOUNTER — Ambulatory Visit: Admitting: Dermatology

## 2024-03-15 ENCOUNTER — Encounter: Payer: Self-pay | Admitting: Dermatology

## 2024-03-15 VITALS — BP 71/45

## 2024-03-15 DIAGNOSIS — L700 Acne vulgaris: Secondary | ICD-10-CM | POA: Diagnosis not present

## 2024-03-15 MED ORDER — DOXYCYCLINE HYCLATE 100 MG PO TABS: ORAL_TABLET | ORAL | 2 refills | Status: AC

## 2024-03-15 MED ORDER — TRETINOIN 0.025 % EX CREA: TOPICAL_CREAM | CUTANEOUS | 1 refills | Status: DC

## 2024-03-15 NOTE — Progress Notes (Signed)
 New Patient Visit   Subjective  Chelsea Contreras is a 28 y.o. female who presents for a NEW PATIENT appointment to be examined for the concerns as listed below.   Acne: Located at the jaw line that presented 2 years ago and would like to have it examined. The areas are painful if she has a cystic flare - rating it 5 out of 10. She has not previously been treated for these areas. She has tried OTC products but have not found anything that works for her. She is looking for product suggestions.   Patient reports Hx of Bx as a child - benign. Patient denied family Hx of skin cancer.   The following portions of the chart were reviewed this encounter and updated as appropriate: medications, allergies, medical history  Review of Systems:  No other skin or systemic complaints except as noted in HPI or Assessment and Plan.  Objective  Well appearing patient in no apparent distress; mood and affect are within normal limits.   A focused examination was performed of the following areas: jaw line   Relevant exam findings are noted in the Assessment and Plan.          Assessment & Plan   ACNE VULGARIS Exam: Open comedones and inflammatory papules  flared - Assessment: Young female patient with sensitive skin presenting with acne, including superficial lesions on the chin and occasional deep bumps. Acne appears hormone-related with cyclical flares. Evidence of post-inflammatory hyperpigmentation and mild scarring. Patient has been using over-the-counter products including Panoxyl, niacinamide, and various moisturizers.  - Plan:    Prescribe doxycycline  for acute flares:     - 1 tablet twice daily with food for up to 7 days     - Start at onset of flare     - Discontinue after 4 days if resolved or after 7 days regardless    Morning regimen:     - Continue Panoxyl face wash     - Apply prescription clindamycin  pledgets     - Apply moisturizer (Vanicream HC or Avene Tolerance)     -  Apply sunscreen (recommended Centella brand or continue current Cococon)    Evening regimen:     - Wash face with gentle cleanser (prescribed Avene gel cleanser)     - Apply thin layer of moisturizer (Vanicream facial lotion or Avene Tolerance)     - Apply pea-sized amount of tretinoin  0.025% every other night     - Apply Avene Cicalfate Balm as final step    Discontinue use of glycolic acid and other potentially irritating products    Patient education:     - Proper use of tretinoin , including potential side effects and sun sensitivity     - Adjust regimen in colder weather     - Restart routine at reduced frequency if irritation occurs     - Potential side effect of upset stomach with doxycycline  if not taken with substantial food  Topical retinoid medications like tretinoin /Retin-A , adapalene/Differin, tazarotene/Fabior, and Epiduo/Epiduo Forte can cause dryness and irritation when first started. Only apply a pea-sized amount to the entire affected area. Avoid applying it around the eyes, edges of mouth and creases at the nose. If you experience irritation, use a good moisturizer first and/or apply the medicine less often. If you are doing well with the medicine, you can increase how often you use it until you are applying every night. Be careful with sun protection while using this medication as it  can make you sensitive to the sun. This medicine should not be used by pregnant women.    Doxycycline  should be taken with food to prevent nausea. Do not lay down for 30 minutes after taking. Be cautious with sun exposure and use good sun protection while on this medication. Pregnant women should not take this medication.     ACNE VULGARIS    Return in about 4 months (around 07/16/2024) for acne.   Documentation: I have reviewed the above documentation for accuracy and completeness, and I agree with the above.  I, Shirron Maranda, CMA, am acting as scribe for [employer], DO.   Delon Lenis, DO

## 2024-03-15 NOTE — Patient Instructions (Addendum)
 Date: Mon Mar 15 2024  Hello [place],  Thank you for visiting today. Here is a summary of the key instructions:  Morning Routine: - Wash face with Panoxyl - Apply clindamycin  pledget (prescription swab) - Apply moisturizer - Apply sunscreen (Centella brand recommended, available at [email or web address])  Evening Routine: - Wash face with Avene gel cleanser - Apply thin layer of Avene Tolerance moisturizer or Vanicream facial lotion - Apply pea-sized amount of tretinoin  0.025% (prescription) every other night - Apply Avene Cicalfate Balm or Vanicream HC on top  Medications: - Doxycycline : Take 1 tablet twice a day with food for up to 7 days when you have a flare-up   - Stop after 4 days if flare-up resolves   - Always take with substantial food (e.g., apple, salad, and sandwich)  Skincare Products to Avoid: - Stop using glycolic acid - Do not use Paula's Choice exfoliation product  Follow-up: - Return for follow-up appointment in 4 months  Please reach out if you have any questions or concerns.  Warm regards,  Dr. Delon Lenis, Dermatology   Important Information  Due to recent changes in healthcare laws, you may see results of your pathology and/or laboratory studies on MyChart before the doctors have had a chance to review them. We understand that in some cases there may be results that are confusing or concerning to you. Please understand that not all results are received at the same time and often the doctors may need to interpret multiple results in order to provide you with the best plan of care or course of treatment. Therefore, we ask that you please give us  2 business days to thoroughly review all your results before contacting the office for clarification. Should we see a critical lab result, you will be contacted sooner.   If You Need Anything After Your Visit  If you have any questions or concerns for your doctor, please call our main line at [phone] If no one  answers, please leave a voicemail as directed and we will return your call as soon as possible. Messages left after 4 pm will be answered the following business day.   You may also send us  a message via MyChart. We typically respond to MyChart messages within 1-2 business days.  For prescription refills, please ask your pharmacy to contact our office. Our fax number is [phone].  If you have an urgent issue when the clinic is closed that cannot wait until the next business day, you can page your doctor at the number below.    Please note that while we do our best to be available for urgent issues outside of office hours, we are not available 24/7.   If you have an urgent issue and are unable to reach us , you may choose to seek medical care at your doctor's office, retail clinic, urgent care center, or emergency room.  If you have a medical emergency, please immediately call 911 or go to the emergency department. In the event of inclement weather, please call our main line at [phone] for an update on the status of any delays or closures.  Dermatology Medication Tips: Please keep the boxes that topical medications come in in order to help keep track of the instructions about where and how to use these. Pharmacies typically print the medication instructions only on the boxes and not directly on the medication tubes.   If your medication is too expensive, please contact our office at [phone] or send us  a message through MyChart.  We are unable to tell what your co-pay for medications will be in advance as this is different depending on your insurance coverage. However, we may be able to find a substitute medication at lower cost or fill out paperwork to get insurance to cover a needed medication.   If a prior authorization is required to get your medication covered by your insurance company, please allow us  1-2 business days to complete this process.  Drug prices often vary  depending on where the prescription is filled and some pharmacies may offer cheaper prices.  The website www.goodrx.com contains coupons for medications through different pharmacies. The prices here do not account for what the cost may be with help from insurance (it may be cheaper with your insurance), but the website can give you the price if you did not use any insurance.  - You can print the associated coupon and take it with your prescription to the pharmacy.  - You may also stop by our office during regular business hours and pick up a GoodRx coupon card.  - If you need your prescription sent electronically to a different pharmacy, notify our office through [facility] or by phone at [phone]

## 2024-03-16 ENCOUNTER — Other Ambulatory Visit: Payer: Self-pay

## 2024-03-16 DIAGNOSIS — L700 Acne vulgaris: Secondary | ICD-10-CM

## 2024-03-16 MED ORDER — RETIN-A 0.025 % EX CREA: TOPICAL_CREAM | Freq: Every day | CUTANEOUS | 0 refills | Status: AC

## 2024-03-18 ENCOUNTER — Other Ambulatory Visit: Payer: Self-pay

## 2024-03-18 MED ORDER — CLINDAMYCIN PHOSPHATE 1 % EX SWAB: 1.0000 | Freq: Every morning | CUTANEOUS | 3 refills | Status: DC

## 2024-07-21 ENCOUNTER — Ambulatory Visit: Admitting: Dermatology

## 2024-08-02 ENCOUNTER — Ambulatory Visit: Admitting: Dermatology

## 2024-08-02 ENCOUNTER — Encounter: Payer: Self-pay | Admitting: Dermatology

## 2024-08-02 VITALS — BP 104/76

## 2024-08-02 DIAGNOSIS — L700 Acne vulgaris: Secondary | ICD-10-CM

## 2024-08-02 MED ORDER — CLINDAMYCIN PHOSPHATE 1 % EX SWAB: 1.0000 | Freq: Every morning | CUTANEOUS | 5 refills | Status: AC

## 2024-08-02 NOTE — Progress Notes (Signed)
   Follow-Up Visit   Subjective  Chelsea Contreras is a 28 y.o. female established patient who presents for FOLLOW UP on the diagnoses listed below:  Patient was last evaluated on 03/15/24.    Acne: Pt has been using clindamycin  swabs every morning but will need a refill today. She stated that she was getting some redness from the Tretinoin  0.025% so she has only been using it one night a week. She has been cleansing with Vanicream and D/C use on Panoxyl. Pt has been using Avene tolerance & cicalfate moisturizer at night.   Are you nursing, pregnant or trying to conceive? No   The following portions of the chart were reviewed this encounter and updated as appropriate: medications, allergies, medical history  Review of Systems:  No other skin or systemic complaints except as noted in HPI or Assessment and Plan.  Objective  Well appearing patient in no apparent distress; mood and affect are within normal limits.   A focused examination was performed of the following areas: face   Relevant exam findings are noted in the Assessment and Plan.          Assessment & Plan   ACNE VULGARIS Exam: Open comedones and inflammatory papules  stable   Well-controlled with current topical regimen. Previous larger acne lesions have resolved. No recent need for oral antibiotics like doxycycline . Tretinoin  is used once a week due to dryness, with plans to increase frequency as weather warms. Clindamycin  is used in the morning. Moisturizers are used appropriately with Tolerance in the morning and Clofarabine at night. Discussion on using retinol eye cream for fine lines around the eyes.  - Continue clindamycin  in the morning with a three-month supply and four refills. - Continue tretinoin  once a week, with plans to increase to two nights in March and three nights in May-June as tolerated. - Use Tolerance as a lighter moisturizer in the morning and Clofarabine at night. - Consider using retinol  eye cream (Rock) for fine lines around the eyes. - Use doxycycline  for a week if a bad flare occurs, ensuring it is taken with a balanced meal. ACNE VULGARIS   Related Medications doxycycline  (VIBRA -TABS) 100 MG tablet Take for flares take BID for 5 days. Take with food. RETIN-A  0.025 % cream Apply topically at bedtime. For first month apply 2 night (M and Thursday) after the first month increase to 3 nights weekly (M,W,F) clindamycin  (CLEOCIN  T) 1 % SWAB Apply 1 application  topically every morning.  Return in about 1 year (around 08/02/2025) for ACNE.   Documentation: I have reviewed the above documentation for accuracy and completeness, and I agree with the above.  I, Shirron Maranda, CMA II, am acting as scribe for:  Delon Lenis, DO

## 2024-08-02 NOTE — Patient Instructions (Addendum)
 VISIT SUMMARY:  Today, we reviewed your progress with acne management. You have shown significant improvement, with larger acne bumps resolving. We discussed your current skincare routine and addressed your concerns about fine lines around your eyes and forehead.  YOUR PLAN:  -ACNE VULGARIS:  Acne vulgaris is a common skin condition that occurs when hair follicles become clogged with oil and dead skin cells. Your acne is well-controlled with your current regimen.   Continue using clindamycin  in the morning and tretinoin  once a week, increasing to two nights in March and three nights in May-June as tolerated.   If you experience a bad flare-up, use doxycycline  for a week with a balanced meal. Consider using a retinol eye cream for fine lines around your eyes.  INSTRUCTIONS:  Follow up in three months to reassess your acne management and make any necessary adjustments to your treatment plan.      Important Information  Due to recent changes in healthcare laws, you may see results of your pathology and/or laboratory studies on MyChart before the doctors have had a chance to review them. We understand that in some cases there may be results that are confusing or concerning to you. Please understand that not all results are received at the same time and often the doctors may need to interpret multiple results in order to provide you with the best plan of care or course of treatment. Therefore, we ask that you please give us  2 business days to thoroughly review all your results before contacting the office for clarification. Should we see a critical lab result, you will be contacted sooner.   If You Need Anything After Your Visit  If you have any questions or concerns for your doctor, please call our main line at [phone] If no one answers, please leave a voicemail as directed and we will return your call as soon as possible. Messages left after 4 pm will be answered the following business  day.   You may also send us  a message via MyChart. We typically respond to MyChart messages within 1-2 business days.  For prescription refills, please ask your pharmacy to contact our office. Our fax number is [phone].  If you have an urgent issue when the clinic is closed that cannot wait until the next business day, you can page your doctor at the number below.    Please note that while we do our best to be available for urgent issues outside of office hours, we are not available 24/7.   If you have an urgent issue and are unable to reach us , you may choose to seek medical care at your doctor's office, retail clinic, urgent care center, or emergency room.  If you have a medical emergency, please immediately call 911 or go to the emergency department. In the event of inclement weather, please call our main line at [phone] for an update on the status of any delays or closures.  Dermatology Medication Tips: Please keep the boxes that topical medications come in in order to help keep track of the instructions about where and how to use these. Pharmacies typically print the medication instructions only on the boxes and not directly on the medication tubes.   If your medication is too expensive, please contact our office at [phone] or send us  a message through MyChart.   We are unable to tell what your co-pay for medications will be in advance as this is different depending on your insurance coverage. However, we may be  able to find a substitute medication at lower cost or fill out paperwork to get insurance to cover a needed medication.   If a prior authorization is required to get your medication covered by your insurance company, please allow us  1-2 business days to complete this process.  Drug prices often vary depending on where the prescription is filled and some pharmacies may offer cheaper prices.  The website www.goodrx.com contains coupons for medications through  different pharmacies. The prices here do not account for what the cost may be with help from insurance (it may be cheaper with your insurance), but the website can give you the price if you did not use any insurance.  - You can print the associated coupon and take it with your prescription to the pharmacy.  - You may also stop by our office during regular business hours and pick up a GoodRx coupon card.  - If you need your prescription sent electronically to a different pharmacy, notify our office through [facility] or by phone at [phone]

## 2025-08-03 ENCOUNTER — Ambulatory Visit: Admitting: Dermatology
